# Patient Record
Sex: Female | Born: 1955 | Race: White | Hispanic: No | Smoking: Former smoker
Health system: Southern US, Community
[De-identification: ages and names within clinical notes are randomized; demographics above are authoritative.]

## PROBLEM LIST (undated history)

## (undated) DIAGNOSIS — Z9889 Other specified postprocedural states: Secondary | ICD-10-CM

## (undated) DIAGNOSIS — D649 Anemia, unspecified: Secondary | ICD-10-CM

## (undated) DIAGNOSIS — M1990 Unspecified osteoarthritis, unspecified site: Secondary | ICD-10-CM

## (undated) DIAGNOSIS — E0590 Thyrotoxicosis, unspecified without thyrotoxic crisis or storm: Secondary | ICD-10-CM

## (undated) DIAGNOSIS — R112 Nausea with vomiting, unspecified: Secondary | ICD-10-CM

## (undated) DIAGNOSIS — K219 Gastro-esophageal reflux disease without esophagitis: Secondary | ICD-10-CM

## (undated) DIAGNOSIS — R011 Cardiac murmur, unspecified: Secondary | ICD-10-CM

## (undated) DIAGNOSIS — J45909 Unspecified asthma, uncomplicated: Secondary | ICD-10-CM

## (undated) DIAGNOSIS — R609 Edema, unspecified: Secondary | ICD-10-CM

## (undated) DIAGNOSIS — R079 Chest pain, unspecified: Secondary | ICD-10-CM

## (undated) HISTORY — PX: APPENDECTOMY: SHX54

## (undated) HISTORY — PX: ROTATOR CUFF REPAIR: SHX139

## (undated) HISTORY — PX: ESOPHAGOGASTRODUODENOSCOPY: SHX1529

## (undated) HISTORY — PX: TUBAL LIGATION: SHX77

## (undated) HISTORY — PX: COLONOSCOPY W/ POLYPECTOMY: SHX1380

## (undated) HISTORY — PX: TONSILLECTOMY: SUR1361

## (undated) HISTORY — PX: FL INJ RT SHOULDER  MR ATHRGM (ARMC HX): HXRAD1296

## (undated) HISTORY — PX: ANKLE ARTHODESIS W/ ARTHROSCOPY: SUR63

## (undated) HISTORY — PX: EXCISIONAL HEMORRHOIDECTOMY: SHX1541

---

## 2004-08-11 ENCOUNTER — Ambulatory Visit: Payer: Self-pay | Admitting: Unknown Physician Specialty

## 2004-09-07 ENCOUNTER — Ambulatory Visit: Payer: Self-pay

## 2004-09-30 ENCOUNTER — Ambulatory Visit: Payer: Self-pay | Admitting: Obstetrics and Gynecology

## 2004-10-31 ENCOUNTER — Ambulatory Visit: Payer: Self-pay | Admitting: Surgery

## 2004-12-15 ENCOUNTER — Ambulatory Visit: Payer: Self-pay | Admitting: Surgery

## 2005-01-05 ENCOUNTER — Ambulatory Visit: Payer: Self-pay | Admitting: General Surgery

## 2005-07-13 ENCOUNTER — Ambulatory Visit: Payer: Self-pay | Admitting: Internal Medicine

## 2005-08-30 ENCOUNTER — Ambulatory Visit: Payer: Self-pay | Admitting: Obstetrics and Gynecology

## 2005-11-01 ENCOUNTER — Ambulatory Visit: Payer: Self-pay | Admitting: Internal Medicine

## 2006-01-11 ENCOUNTER — Ambulatory Visit: Payer: Self-pay | Admitting: General Surgery

## 2006-07-23 ENCOUNTER — Ambulatory Visit: Payer: Self-pay | Admitting: Obstetrics and Gynecology

## 2006-08-28 ENCOUNTER — Ambulatory Visit: Payer: Self-pay | Admitting: Internal Medicine

## 2006-08-29 ENCOUNTER — Ambulatory Visit: Payer: Self-pay | Admitting: Obstetrics and Gynecology

## 2006-08-29 ENCOUNTER — Other Ambulatory Visit: Payer: Self-pay

## 2006-09-07 ENCOUNTER — Ambulatory Visit: Payer: Self-pay | Admitting: Obstetrics and Gynecology

## 2006-10-19 ENCOUNTER — Ambulatory Visit: Payer: Self-pay | Admitting: Obstetrics and Gynecology

## 2007-01-15 ENCOUNTER — Ambulatory Visit: Payer: Self-pay | Admitting: General Surgery

## 2007-04-02 ENCOUNTER — Ambulatory Visit: Payer: Self-pay | Admitting: Internal Medicine

## 2007-04-08 ENCOUNTER — Ambulatory Visit: Payer: Self-pay | Admitting: Internal Medicine

## 2007-04-15 ENCOUNTER — Ambulatory Visit: Payer: Self-pay | Admitting: Internal Medicine

## 2007-04-25 ENCOUNTER — Ambulatory Visit: Payer: Self-pay | Admitting: Internal Medicine

## 2007-06-19 ENCOUNTER — Ambulatory Visit: Payer: Self-pay | Admitting: Internal Medicine

## 2007-09-17 ENCOUNTER — Ambulatory Visit: Payer: Self-pay | Admitting: Internal Medicine

## 2007-12-05 ENCOUNTER — Ambulatory Visit: Payer: Self-pay | Admitting: General Surgery

## 2007-12-20 ENCOUNTER — Ambulatory Visit: Payer: Self-pay | Admitting: General Surgery

## 2008-01-17 ENCOUNTER — Ambulatory Visit: Payer: Self-pay | Admitting: General Surgery

## 2008-04-03 ENCOUNTER — Ambulatory Visit: Payer: Self-pay

## 2008-05-22 ENCOUNTER — Ambulatory Visit: Payer: Self-pay | Admitting: Podiatry

## 2009-01-21 ENCOUNTER — Ambulatory Visit: Payer: Self-pay

## 2009-08-25 ENCOUNTER — Ambulatory Visit: Payer: Self-pay | Admitting: General Practice

## 2009-09-23 ENCOUNTER — Encounter: Payer: Self-pay | Admitting: Physician Assistant

## 2009-10-10 ENCOUNTER — Encounter: Payer: Self-pay | Admitting: Physician Assistant

## 2009-11-04 ENCOUNTER — Ambulatory Visit: Payer: Self-pay | Admitting: Unknown Physician Specialty

## 2009-11-10 ENCOUNTER — Encounter: Payer: Self-pay | Admitting: Physician Assistant

## 2009-12-10 ENCOUNTER — Encounter: Payer: Self-pay | Admitting: Physician Assistant

## 2010-01-03 ENCOUNTER — Ambulatory Visit: Payer: Self-pay

## 2010-01-10 ENCOUNTER — Encounter: Payer: Self-pay | Admitting: Physician Assistant

## 2010-01-14 ENCOUNTER — Ambulatory Visit: Payer: Self-pay | Admitting: Unknown Physician Specialty

## 2010-02-10 ENCOUNTER — Encounter: Payer: Self-pay | Admitting: Physician Assistant

## 2011-02-17 ENCOUNTER — Ambulatory Visit: Payer: Self-pay | Admitting: Internal Medicine

## 2011-03-07 ENCOUNTER — Ambulatory Visit: Payer: Self-pay

## 2011-03-27 ENCOUNTER — Encounter: Payer: Self-pay | Admitting: Neurosurgery

## 2011-04-13 ENCOUNTER — Encounter: Payer: Self-pay | Admitting: Neurosurgery

## 2011-11-06 ENCOUNTER — Other Ambulatory Visit: Payer: Self-pay | Admitting: Internal Medicine

## 2011-11-06 LAB — CBC WITH DIFFERENTIAL/PLATELET
Basophil #: 0.1 10*3/uL (ref 0.0–0.1)
Eosinophil #: 0.1 10*3/uL (ref 0.0–0.7)
Eosinophil %: 1.3 %
HCT: 36.8 % (ref 35.0–47.0)
HGB: 12.1 g/dL (ref 12.0–16.0)
Lymphocyte %: 21.5 %
MCHC: 33 g/dL (ref 32.0–36.0)
Monocyte %: 5.3 %
Neutrophil %: 71.1 %
Platelet: 306 10*3/uL (ref 150–440)
RBC: 4.19 10*6/uL (ref 3.80–5.20)
WBC: 7.2 10*3/uL (ref 3.6–11.0)

## 2011-11-06 LAB — BASIC METABOLIC PANEL
Anion Gap: 11 (ref 7–16)
Calcium, Total: 9.1 mg/dL (ref 8.5–10.1)
Chloride: 105 mmol/L (ref 98–107)
EGFR (African American): >60
EGFR (Non-African Amer.): >60
Glucose: 84 mg/dL (ref 65–99)
Osmolality: 284 (ref 275–301)

## 2011-11-06 LAB — URINALYSIS, COMPLETE
Blood: NEGATIVE
Glucose,UR: NEGATIVE mg/dL (ref 0–75)
Ph: 6 (ref 4.5–8.0)
RBC,UR: <1 /HPF (ref 0–5)
Squamous Epithelial: 1
WBC UR: NONE SEEN /HPF (ref 0–5)

## 2011-11-06 LAB — LIPID PANEL
Cholesterol: 207 mg/dL — ABNORMAL HIGH (ref 0–200)
Ldl Cholesterol, Calc: 106 mg/dL — ABNORMAL HIGH (ref 0–100)
VLDL Cholesterol, Calc: 21 mg/dL (ref 5–40)

## 2011-11-06 LAB — HEPATIC FUNCTION PANEL A (ARMC)
Alkaline Phosphatase: 96 U/L (ref 50–136)
Bilirubin,Total: 0.3 mg/dL (ref 0.2–1.0)
Total Protein: 7.5 g/dL (ref 6.4–8.2)

## 2011-11-06 LAB — IRON: Iron: 38 ug/dL — ABNORMAL LOW (ref 50–170)

## 2011-11-06 LAB — TSH: Thyroid Stimulating Horm: 1.31 u[IU]/mL

## 2011-11-07 ENCOUNTER — Ambulatory Visit: Payer: Self-pay | Admitting: Internal Medicine

## 2014-02-24 ENCOUNTER — Ambulatory Visit: Payer: Self-pay | Admitting: Internal Medicine

## 2015-01-04 ENCOUNTER — Other Ambulatory Visit: Payer: Self-pay | Admitting: Internal Medicine

## 2015-01-04 DIAGNOSIS — Z1231 Encounter for screening mammogram for malignant neoplasm of breast: Secondary | ICD-10-CM

## 2015-02-26 ENCOUNTER — Ambulatory Visit: Payer: Self-pay

## 2015-03-05 ENCOUNTER — Ambulatory Visit
Admission: RE | Admit: 2015-03-05 | Discharge: 2015-03-05 | Disposition: A | Discharge status: Home / Self Care | Payer: BC Managed Care – PPO | Source: Ambulatory Visit | Attending: Internal Medicine | Admitting: Internal Medicine

## 2015-03-05 DIAGNOSIS — Z1231 Encounter for screening mammogram for malignant neoplasm of breast: Secondary | ICD-10-CM | POA: Diagnosis not present

## 2015-12-23 ENCOUNTER — Encounter: Payer: Self-pay | Admitting: *Deleted

## 2015-12-24 ENCOUNTER — Ambulatory Visit: Payer: BC Managed Care – PPO | Admitting: Certified Registered Nurse Anesthetist

## 2015-12-24 ENCOUNTER — Encounter
Admission: RE | Disposition: A | Discharge status: Home / Self Care | Payer: Self-pay | Source: Ambulatory Visit | Attending: Unknown Physician Specialty

## 2015-12-24 ENCOUNTER — Ambulatory Visit
Admission: RE | Admit: 2015-12-24 | Discharge: 2015-12-24 | Disposition: A | Discharge status: Home / Self Care | Payer: BC Managed Care – PPO | Source: Ambulatory Visit | Attending: Unknown Physician Specialty | Admitting: Unknown Physician Specialty

## 2015-12-24 DIAGNOSIS — Z1211 Encounter for screening for malignant neoplasm of colon: Secondary | ICD-10-CM | POA: Insufficient documentation

## 2015-12-24 DIAGNOSIS — K648 Other hemorrhoids: Secondary | ICD-10-CM | POA: Diagnosis not present

## 2015-12-24 DIAGNOSIS — Z86010 Personal history of colonic polyps: Secondary | ICD-10-CM | POA: Insufficient documentation

## 2015-12-24 DIAGNOSIS — K640 First degree hemorrhoids: Secondary | ICD-10-CM | POA: Insufficient documentation

## 2015-12-24 DIAGNOSIS — Z6838 Body mass index (BMI) 38.0-38.9, adult: Secondary | ICD-10-CM | POA: Insufficient documentation

## 2015-12-24 DIAGNOSIS — Z791 Long term (current) use of non-steroidal anti-inflammatories (NSAID): Secondary | ICD-10-CM | POA: Insufficient documentation

## 2015-12-24 DIAGNOSIS — Z87891 Personal history of nicotine dependence: Secondary | ICD-10-CM | POA: Diagnosis not present

## 2015-12-24 DIAGNOSIS — Z800 Family history of malignant neoplasm of digestive organs: Secondary | ICD-10-CM | POA: Diagnosis not present

## 2015-12-24 DIAGNOSIS — K219 Gastro-esophageal reflux disease without esophagitis: Secondary | ICD-10-CM | POA: Insufficient documentation

## 2015-12-24 DIAGNOSIS — D123 Benign neoplasm of transverse colon: Secondary | ICD-10-CM | POA: Diagnosis not present

## 2015-12-24 DIAGNOSIS — Z803 Family history of malignant neoplasm of breast: Secondary | ICD-10-CM | POA: Insufficient documentation

## 2015-12-24 DIAGNOSIS — D122 Benign neoplasm of ascending colon: Secondary | ICD-10-CM | POA: Diagnosis not present

## 2015-12-24 DIAGNOSIS — J45909 Unspecified asthma, uncomplicated: Secondary | ICD-10-CM | POA: Diagnosis not present

## 2015-12-24 DIAGNOSIS — M1990 Unspecified osteoarthritis, unspecified site: Secondary | ICD-10-CM | POA: Diagnosis not present

## 2015-12-24 DIAGNOSIS — K635 Polyp of colon: Secondary | ICD-10-CM | POA: Insufficient documentation

## 2015-12-24 DIAGNOSIS — E0590 Thyrotoxicosis, unspecified without thyrotoxic crisis or storm: Secondary | ICD-10-CM | POA: Insufficient documentation

## 2015-12-24 DIAGNOSIS — Z9889 Other specified postprocedural states: Secondary | ICD-10-CM | POA: Diagnosis not present

## 2015-12-24 DIAGNOSIS — K5730 Diverticulosis of large intestine without perforation or abscess without bleeding: Secondary | ICD-10-CM | POA: Insufficient documentation

## 2015-12-24 HISTORY — DX: Nausea with vomiting, unspecified: R11.2

## 2015-12-24 HISTORY — DX: Cardiac murmur, unspecified: R01.1

## 2015-12-24 HISTORY — DX: Unspecified asthma, uncomplicated: J45.909

## 2015-12-24 HISTORY — DX: Edema, unspecified: R60.9

## 2015-12-24 HISTORY — DX: Other specified postprocedural states: Z98.890

## 2015-12-24 HISTORY — DX: Chest pain, unspecified: R07.9

## 2015-12-24 HISTORY — DX: Thyrotoxicosis, unspecified without thyrotoxic crisis or storm: E05.90

## 2015-12-24 HISTORY — DX: Gastro-esophageal reflux disease without esophagitis: K21.9

## 2015-12-24 HISTORY — DX: Unspecified osteoarthritis, unspecified site: M19.90

## 2015-12-24 HISTORY — PX: COLONOSCOPY WITH PROPOFOL: SHX5780

## 2015-12-24 HISTORY — DX: Anemia, unspecified: D64.9

## 2015-12-24 SURGERY — COLONOSCOPY WITH PROPOFOL
Anesthesia: General

## 2015-12-24 MED ORDER — SODIUM CHLORIDE 0.9 % IV SOLN
INTRAVENOUS | Status: DC
  Administered 2015-12-24: 1000 mL via INTRAVENOUS

## 2015-12-24 MED ORDER — PROPOFOL 10 MG/ML IV BOLUS
INTRAVENOUS | Status: DC | PRN
  Administered 2015-12-24: 50 mg via INTRAVENOUS
  Administered 2015-12-24: 10 mg via INTRAVENOUS

## 2015-12-24 MED ORDER — LIDOCAINE HCL (CARDIAC) 20 MG/ML IV SOLN
INTRAVENOUS | Status: DC | PRN
  Administered 2015-12-24: 40 mg via INTRAVENOUS

## 2015-12-24 MED ORDER — SODIUM CHLORIDE 0.9 % IV SOLN: INTRAVENOUS | Status: DC

## 2015-12-24 MED ORDER — PROPOFOL 500 MG/50ML IV EMUL
INTRAVENOUS | Status: DC | PRN
  Administered 2015-12-24: 160 ug/kg/min via INTRAVENOUS

## 2015-12-24 NOTE — H&P (Signed)
   Primary Care Physician:  Adin Hector, MD Primary Gastroenterologist:  Dr. Vira Agar  Pre-Procedure History & Physical: HPI:  Stacy Jones is a 60 y.o. female is here for an colonoscopy.   Past Medical History  Diagnosis Date  . Asthma   . Arthritis   . GERD (gastroesophageal reflux disease)   . Chest pain   . Edema     ankles  . Hyperthyroidism   . Anemia   . PONV (postoperative nausea and vomiting)   . Heart murmur     Past Surgical History  Procedure Laterality Date  . Colonoscopy w/ polypectomy    . Cesarean section    . Tonsillectomy    . Appendectomy    . Excisional hemorrhoidectomy    . Ankle arthodesis w/ arthroscopy    . Fl inj rt shoulder  mr athrgm ([facility] hx)    . Tubal ligation    . Rotator cuff repair Left   . Esophagogastroduodenoscopy      Prior to Admission medications   Medication Sig Start Date End Date Taking? Authorizing Provider  calcium carbonate (TUMS - DOSED IN MG ELEMENTAL CALCIUM) 500 MG chewable tablet Chew 1 tablet by mouth daily.   Yes Historical Provider, MD  naproxen sodium (ANAPROX) 220 MG tablet Take 220 mg by mouth 2 (two) times daily with a meal.   Yes Historical Provider, MD    Allergies as of 11/29/2015  . (Not on File)    Family History  Problem Relation Age of Onset  . Breast cancer Maternal Aunt   . Breast cancer Paternal Aunt     Social History   Social History  . Marital Status: Married    Spouse Name: N/A  . Number of Children: N/A  . Years of Education: N/A   Occupational History  . Not on file.   Social History Main Topics  . Smoking status: Former Research scientist (life sciences)  . Smokeless tobacco: Not on file  . Alcohol Use: No  . Drug Use: No  . Sexual Activity: Not on file   Other Topics Concern  . Not on file   Social History Narrative    Review of Systems: See HPI, otherwise negative ROS  Physical Exam: BP 145/78 mmHg  Pulse 63  Temp(Src) 98.5 F (36.9 C) (Tympanic)  Resp 18  Ht 5\' 3"  (1.6 m)  Wt  99.791 kg (220 lb)  BMI 38.98 kg/m2  SpO2 99% General:   Alert,  pleasant and cooperative in NAD Head:  Normocephalic and atraumatic. Neck:  Supple; no masses or thyromegaly. Lungs:  Clear throughout to auscultation.    Heart:  Regular rate and rhythm. Abdomen:  Soft, nontender and nondistended. Normal bowel sounds, without guarding, and without rebound.   Neurologic:  Alert and  oriented x4;  grossly normal neurologically.  Impression/Plan: Stacy Jones is here for an colonoscopy to be performed for [facility] colon polyps  Risks, benefits, limitations, and alternatives regarding  colonoscopy have been reviewed with the patient.  Questions have been answered.  All parties agreeable.   Gaylyn Cheers, MD  12/24/2015, 1:19 PM

## 2015-12-24 NOTE — Transfer of Care (Signed)
Immediate Anesthesia Transfer of Care Note  Patient: Stacy Jones  Procedure(s) Performed: Procedure(s): COLONOSCOPY WITH PROPOFOL (N/A)  Patient Location: PACU  Anesthesia Type:General  Level of Consciousness: awake, alert  and oriented  Airway & Oxygen Therapy: Patient Spontanous Breathing and Patient connected to nasal cannula oxygen  Post-op Assessment: Report given to RN and Post -op Vital signs reviewed and stable  Post vital signs: Reviewed and stable  Last Vitals:  Filed Vitals:   12/24/15 1234  BP: 145/78  Pulse: 63  Temp: 36.9 C  Resp: 18    Last Pain: There were no vitals filed for this visit.       Complications: No apparent anesthesia complications

## 2015-12-24 NOTE — Anesthesia Preprocedure Evaluation (Signed)
Anesthesia Evaluation  Patient identified by MRN, date of birth, ID band Patient awake    Reviewed: Allergy & Precautions, H&P , NPO status , Patient's Chart, lab work & pertinent test results, reviewed documented beta blocker date and time   History of Anesthesia Complications (+) PONV and history of anesthetic complications  Airway Mallampati: I  TM Distance: >3 FB Neck ROM: full    Dental no notable dental hx. (+) Caps, Teeth Intact   Pulmonary neg shortness of breath, asthma , neg sleep apnea, neg COPD, neg recent URI, former smoker,    Pulmonary exam normal breath sounds clear to auscultation       Cardiovascular Exercise Tolerance: Good (-) angina(-) CAD, (-) Past MI, (-) Cardiac Stents and (-) CABG Normal cardiovascular exam(-) dysrhythmias + Valvular Problems/Murmurs  Rhythm:regular Rate:Normal     Neuro/Psych negative neurological ROS  negative psych ROS   GI/Hepatic Neg liver ROS, GERD  ,  Endo/Other  neg diabetesMorbid obesity  Renal/GU negative Renal ROS  negative genitourinary   Musculoskeletal   Abdominal   Peds  Hematology  (+) Blood dyscrasia, anemia ,   Anesthesia Other Findings Past Medical History:   Asthma                                                       Arthritis                                                    GERD (gastroesophageal reflux disease)                       Chest pain                                                   Edema                                                          Comment:ankles   Hyperthyroidism                                              Anemia                                                       PONV (postoperative nausea and vomiting)                     Heart murmur  Reproductive/Obstetrics negative OB ROS                             Anesthesia Physical Anesthesia  Plan  ASA: III  Anesthesia Plan: General   Post-op Pain Management:    Induction:   Airway Management Planned:   Additional Equipment:   Intra-op Plan:   Post-operative Plan:   Informed Consent: I have reviewed the patients History and Physical, chart, labs and discussed the procedure including the risks, benefits and alternatives for the proposed anesthesia with the patient or authorized representative who has indicated his/her understanding and acceptance.   Dental Advisory Given  Plan Discussed with: Anesthesiologist, CRNA and Surgeon  Anesthesia Plan Comments:         Anesthesia Quick Evaluation

## 2015-12-24 NOTE — Op Note (Signed)
[facility] Gastroenterology Patient Name: Stacy Jones Procedure Date: 12/24/2015 1:21 PM MRN: [id number] Account #: [id number] Date of Birth: 08-Jun-1956 Admit Type: Outpatient Age: 60 Room: [facility] ENDO ROOM 3 Gender: Female Note Status: Finalized Procedure:            Colonoscopy Indications:          High risk colon cancer surveillance: Personal history                        of colonic polyps Providers:            Manya Silvas, MD Referring MD:         Ramonita Lab, MD (Referring MD) Medicines:            Propofol per Anesthesia Procedure:            Pre-Anesthesia Assessment:                       - After reviewing the risks and benefits, the patient                        was deemed in satisfactory condition to undergo the                        procedure.                       After obtaining informed consent, the colonoscope was                        passed under direct vision. Throughout the procedure,                        the patient's blood pressure, pulse, and oxygen                        saturations were monitored continuously. The                        Colonoscope was introduced through the anus and                        advanced to the the cecum, identified by appendiceal                        orifice and ileocecal valve. The colonoscopy was                        performed without difficulty. The patient tolerated the                        procedure well. The quality of the bowel preparation                        was excellent. Findings:      A small polyp was found in the hepatic flexure. The polyp was sessile.       The polyp was removed with a hot snare. Resection and retrieval were       complete.      Three sessile polyps were found in the ascending colon. The polyps were  diminutive in size. These polyps were removed with a jumbo cold forceps.       Resection and retrieval were complete.      Internal hemorrhoids were found  during endoscopy. The hemorrhoids were       small and Grade I (internal hemorrhoids that do not prolapse).      A few small-mouthed diverticula were found in the sigmoid colon.      The exam was otherwise without abnormality. Impression:           - One small polyp at the hepatic flexure, removed with                        a hot snare. Resected and retrieved.                       - Three diminutive polyps in the ascending colon,                        removed with a jumbo cold forceps. Resected and                        retrieved.                       - Internal hemorrhoids.                       - The examination was otherwise normal. Recommendation:       - Await pathology results. Manya Silvas, MD 12/24/2015 2:16:19 PM This report has been signed electronically. Number of Addenda: 0 Note Initiated On: 12/24/2015 1:21 PM Scope Withdrawal Time: 0 hours 9 minutes 15 seconds  Total Procedure Duration: 0 hours 18 minutes 45 seconds       [facility]

## 2015-12-25 NOTE — Progress Notes (Signed)
PT. Has a queasy stomach but said she is better. Refused advisement to call dr and stated she would call later on if she did not feel better.

## 2015-12-26 ENCOUNTER — Encounter: Payer: Self-pay | Admitting: Unknown Physician Specialty

## 2015-12-26 NOTE — Anesthesia Postprocedure Evaluation (Signed)
Anesthesia Post Note  Patient: Clarita Leber Correia  Procedure(s) Performed: Procedure(s) (LRB): COLONOSCOPY WITH PROPOFOL (N/A)  Patient location during evaluation: Endoscopy Anesthesia Type: General Level of consciousness: awake and alert Pain management: pain level controlled Vital Signs Assessment: post-procedure vital signs reviewed and stable Respiratory status: spontaneous breathing, nonlabored ventilation, respiratory function stable and patient connected to nasal cannula oxygen Cardiovascular status: blood pressure returned to baseline and stable Postop Assessment: no signs of nausea or vomiting Anesthetic complications: no    Last Vitals:  Filed Vitals:   12/24/15 1450 12/24/15 1452  BP: 123/49 123/49  Pulse: 49 51  Temp:    Resp: 20 13    Last Pain:  Filed Vitals:   12/25/15 1453  PainSc: 0-No pain                 Martha Clan

## 2015-12-29 LAB — SURGICAL PATHOLOGY

## 2016-01-27 ENCOUNTER — Other Ambulatory Visit: Payer: Self-pay | Admitting: Internal Medicine

## 2016-01-27 DIAGNOSIS — Z1231 Encounter for screening mammogram for malignant neoplasm of breast: Secondary | ICD-10-CM

## 2016-03-06 ENCOUNTER — Ambulatory Visit: Payer: BC Managed Care – PPO

## 2016-03-08 ENCOUNTER — Other Ambulatory Visit: Payer: Self-pay | Admitting: Internal Medicine

## 2016-03-08 ENCOUNTER — Ambulatory Visit
Admission: RE | Admit: 2016-03-08 | Discharge: 2016-03-08 | Disposition: A | Discharge status: Home / Self Care | Payer: BC Managed Care – PPO | Source: Ambulatory Visit | Attending: Internal Medicine | Admitting: Internal Medicine

## 2016-03-08 DIAGNOSIS — Z1231 Encounter for screening mammogram for malignant neoplasm of breast: Secondary | ICD-10-CM

## 2017-01-24 ENCOUNTER — Other Ambulatory Visit: Payer: Self-pay | Admitting: Internal Medicine

## 2017-01-24 DIAGNOSIS — Z1231 Encounter for screening mammogram for malignant neoplasm of breast: Secondary | ICD-10-CM

## 2017-03-14 ENCOUNTER — Ambulatory Visit
Admission: RE | Admit: 2017-03-14 | Discharge: 2017-03-14 | Disposition: A | Discharge status: Home / Self Care | Payer: BC Managed Care – PPO | Source: Ambulatory Visit | Attending: Internal Medicine | Admitting: Internal Medicine

## 2017-03-14 DIAGNOSIS — Z1231 Encounter for screening mammogram for malignant neoplasm of breast: Secondary | ICD-10-CM | POA: Insufficient documentation

## 2017-07-20 ENCOUNTER — Emergency Department (HOSPITAL_COMMUNITY)
Admission: EM | Admit: 2017-07-20 | Discharge: 2017-07-20 | Disposition: A | Discharge status: Home / Self Care | Payer: BC Managed Care – PPO | Attending: Emergency Medicine | Admitting: Emergency Medicine

## 2017-07-20 ENCOUNTER — Other Ambulatory Visit: Payer: Self-pay

## 2017-07-20 ENCOUNTER — Encounter (HOSPITAL_COMMUNITY): Payer: Self-pay | Admitting: Emergency Medicine

## 2017-07-20 ENCOUNTER — Emergency Department (HOSPITAL_COMMUNITY): Payer: BC Managed Care – PPO

## 2017-07-20 DIAGNOSIS — J40 Bronchitis, not specified as acute or chronic: Secondary | ICD-10-CM

## 2017-07-20 DIAGNOSIS — Z87891 Personal history of nicotine dependence: Secondary | ICD-10-CM | POA: Insufficient documentation

## 2017-07-20 DIAGNOSIS — J101 Influenza due to other identified influenza virus with other respiratory manifestations: Secondary | ICD-10-CM | POA: Insufficient documentation

## 2017-07-20 DIAGNOSIS — R509 Fever, unspecified: Secondary | ICD-10-CM | POA: Diagnosis present

## 2017-07-20 DIAGNOSIS — Z79899 Other long term (current) drug therapy: Secondary | ICD-10-CM | POA: Diagnosis not present

## 2017-07-20 DIAGNOSIS — N390 Urinary tract infection, site not specified: Secondary | ICD-10-CM | POA: Insufficient documentation

## 2017-07-20 DIAGNOSIS — R55 Syncope and collapse: Secondary | ICD-10-CM | POA: Insufficient documentation

## 2017-07-20 LAB — INFLUENZA PANEL BY PCR (TYPE A & B)
INFLAPCR: NEGATIVE
Influenza B By PCR: POSITIVE — AB

## 2017-07-20 LAB — COMPREHENSIVE METABOLIC PANEL
ALBUMIN: 3.9 g/dL (ref 3.5–5.0)
ALK PHOS: 80 U/L (ref 38–126)
ALT: 18 U/L (ref 14–54)
ANION GAP: 10 (ref 5–15)
AST: 20 U/L (ref 15–41)
BILIRUBIN TOTAL: 0.4 mg/dL (ref 0.3–1.2)
BUN: 14 mg/dL (ref 6–20)
CALCIUM: 8.6 mg/dL — AB (ref 8.9–10.3)
CO2: 26 mmol/L (ref 22–32)
CREATININE: 1.08 mg/dL — AB (ref 0.44–1.00)
Chloride: 100 mmol/L — ABNORMAL LOW (ref 101–111)
GFR calc non Af Amer: 55 mL/min — ABNORMAL LOW (ref >60)
GLUCOSE: 122 mg/dL — AB (ref 65–99)
Potassium: 3.2 mmol/L — ABNORMAL LOW (ref 3.5–5.1)
Sodium: 136 mmol/L (ref 135–145)
Total Protein: 7.2 g/dL (ref 6.5–8.1)

## 2017-07-20 LAB — URINALYSIS, ROUTINE W REFLEX MICROSCOPIC
BILIRUBIN URINE: NEGATIVE
Glucose, UA: NEGATIVE mg/dL
HGB URINE DIPSTICK: NEGATIVE
Ketones, ur: NEGATIVE mg/dL
Nitrite: NEGATIVE
PH: 5 (ref 5.0–8.0)
Protein, ur: 30 mg/dL — AB
SPECIFIC GRAVITY, URINE: 1.017 (ref 1.005–1.030)

## 2017-07-20 LAB — CBC
HCT: 39.5 % (ref 36.0–46.0)
Hemoglobin: 12.7 g/dL (ref 12.0–15.0)
MCH: 29.3 pg (ref 26.0–34.0)
MCHC: 32.2 g/dL (ref 30.0–36.0)
MCV: 91.2 fL (ref 78.0–100.0)
PLATELETS: 221 10*3/uL (ref 150–400)
RBC: 4.33 MIL/uL (ref 3.87–5.11)
RDW: 13.4 % (ref 11.5–15.5)
WBC: 6.9 10*3/uL (ref 4.0–10.5)

## 2017-07-20 LAB — CBG MONITORING, ED: Glucose-Capillary: 116 mg/dL — ABNORMAL HIGH (ref 65–99)

## 2017-07-20 LAB — TROPONIN I: Troponin I: <0.03 ng/mL (ref <0.03)

## 2017-07-20 MED ORDER — ALBUTEROL SULFATE HFA 108 (90 BASE) MCG/ACT IN AERS
2.0000 | INHALATION_SPRAY | Freq: Once | RESPIRATORY_TRACT | Status: AC
  Administered 2017-07-20: 2 via RESPIRATORY_TRACT
  Filled 2017-07-20: qty 6.7

## 2017-07-20 MED ORDER — BENZONATATE 100 MG PO CAPS: 200.0000 mg | ORAL_CAPSULE | Freq: Three times a day (TID) | ORAL | 0 refills | Status: AC

## 2017-07-20 MED ORDER — ONDANSETRON HCL 4 MG PO TABS
4.0000 mg | ORAL_TABLET | Freq: Once | ORAL | Status: AC
  Administered 2017-07-20: 4 mg via ORAL
  Filled 2017-07-20: qty 1

## 2017-07-20 MED ORDER — OSELTAMIVIR PHOSPHATE 75 MG PO CAPS: 75.0000 mg | ORAL_CAPSULE | Freq: Two times a day (BID) | ORAL | 0 refills | Status: AC

## 2017-07-20 MED ORDER — BENZONATATE 100 MG PO CAPS
200.0000 mg | ORAL_CAPSULE | Freq: Once | ORAL | Status: AC
  Administered 2017-07-20: 200 mg via ORAL
  Filled 2017-07-20: qty 2

## 2017-07-20 MED ORDER — METHYLPREDNISOLONE SODIUM SUCC 125 MG IJ SOLR
80.0000 mg | Freq: Once | INTRAMUSCULAR | Status: AC
  Administered 2017-07-20: 80 mg via INTRAVENOUS
  Filled 2017-07-20: qty 2

## 2017-07-20 MED ORDER — DEXAMETHASONE 4 MG PO TABS: 4.0000 mg | ORAL_TABLET | Freq: Two times a day (BID) | ORAL | 0 refills | Status: AC

## 2017-07-20 MED ORDER — IPRATROPIUM-ALBUTEROL 0.5-2.5 (3) MG/3ML IN SOLN
3.0000 mL | Freq: Once | RESPIRATORY_TRACT | Status: AC
  Administered 2017-07-20: 3 mL via RESPIRATORY_TRACT
  Filled 2017-07-20: qty 3

## 2017-07-20 MED ORDER — POTASSIUM CHLORIDE CRYS ER 20 MEQ PO TBCR
40.0000 meq | EXTENDED_RELEASE_TABLET | Freq: Once | ORAL | Status: AC
  Administered 2017-07-20: 40 meq via ORAL
  Filled 2017-07-20: qty 2

## 2017-07-20 MED ORDER — ACETAMINOPHEN 500 MG PO TABS
1000.0000 mg | ORAL_TABLET | Freq: Once | ORAL | Status: AC
  Administered 2017-07-20: 1000 mg via ORAL
  Filled 2017-07-20: qty 2

## 2017-07-20 NOTE — ED Provider Notes (Signed)
[facility] EMERGENCY DEPARTMENT Provider Note   CSN: [id number] Arrival date & time: 07/20/17  1014     History   Chief Complaint Chief Complaint  Patient presents with  . Loss of Consciousness    HPI Stacy Jones is a 62 y.o. female.  Patient is a 62 year old female who presents EMS because of upper respiratory symptoms and passing out.  Patient states that she has been sick for approximately 3 days.  She has been having cough, congestion, and fever.  She is also noted wheezing.  Prior to her admission to the emergency department the patient states that she passed out for about 2 minutes, which is what she also shared with the EMS professionals.  No chest pain reported.  Patient does report a heaviness in her chest that she says seems to be related to the cough and wheezing.  It is also of note that the patient's daughter and granddaughter were both found to be positive for influenza a few days ago.  Patient has a history of asthma and she request to be evaluated for her multiple symptoms and situations.  No other syncopal episodes reported.   The history is provided by the patient.    Past Medical History:  Diagnosis Date  . Anemia   . Arthritis   . Asthma   . Chest pain   . Edema    ankles  . GERD (gastroesophageal reflux disease)   . Heart murmur   . Hyperthyroidism   . PONV (postoperative nausea and vomiting)     There are no active problems to display for this patient.   Past Surgical History:  Procedure Laterality Date  . ANKLE ARTHODESIS W/ ARTHROSCOPY    . APPENDECTOMY    . CESAREAN SECTION    . COLONOSCOPY W/ POLYPECTOMY    . COLONOSCOPY WITH PROPOFOL N/A 12/24/2015   Procedure: COLONOSCOPY WITH PROPOFOL;  Surgeon: Manya Silvas, MD;  Location: [facility] ENDOSCOPY;  Service: Endoscopy;  Laterality: N/A;  . ESOPHAGOGASTRODUODENOSCOPY    . EXCISIONAL HEMORRHOIDECTOMY    . FL INJ RT SHOULDER  MR ATHRGM ([facility] HX)    . ROTATOR CUFF REPAIR Left   .  TONSILLECTOMY    . TUBAL LIGATION      OB History    No data available       Home Medications    Prior to Admission medications   Medication Sig Start Date End Date Taking? Authorizing Provider  calcium carbonate (TUMS - DOSED IN MG ELEMENTAL CALCIUM) 500 MG chewable tablet Chew 1 tablet by mouth daily.    [provider]  naproxen sodium (ANAPROX) 220 MG tablet Take 220 mg by mouth 2 (two) times daily with a meal.    [provider]    Family History Family History  Problem Relation Age of Onset  . Breast cancer Maternal Aunt   . Breast cancer Paternal Aunt     Social History Social History   Tobacco Use  . Smoking status: Former Research scientist (life sciences)  . Smokeless tobacco: Never Used  Substance Use Topics  . Alcohol use: No  . Drug use: No     Allergies   Erythromycin; Hydrocodone-homatropine; and Percocet [oxycodone-acetaminophen]   Review of Systems Review of Systems  Constitutional: Positive for activity change, appetite change, fatigue and fever.       All ROS Neg except as noted in HPI  HENT: Positive for congestion, postnasal drip and sinus pressure. Negative for nosebleeds.   Eyes: Negative for photophobia  and discharge.  Respiratory: Positive for cough and wheezing. Negative for shortness of breath.   Cardiovascular: Negative for chest pain and palpitations.  Gastrointestinal: Negative for abdominal pain and blood in stool.  Genitourinary: Negative for dysuria, frequency and hematuria.  Musculoskeletal: Negative for arthralgias, back pain and neck pain.  Skin: Negative.   Neurological: Positive for syncope. Negative for dizziness, seizures and speech difficulty.  Psychiatric/Behavioral: Negative for confusion and hallucinations.     Physical Exam Updated Vital Signs BP 114/68 (BP Location: Left Arm)   Pulse 72   Temp 98.5 F (36.9 C) (Oral)   Resp 18   Ht 5\' 3"  (1.6 m)   Wt 99.8 kg (220 lb)   SpO2 98%   BMI 38.97 kg/m   Physical Exam    Constitutional: She is oriented to person, place, and time. She appears well-developed and well-nourished.  Non-toxic appearance.  HENT:  Head: Normocephalic.  Right Ear: Tympanic membrane and external ear normal.  Left Ear: Tympanic membrane and external ear normal.  Nasal congestion present.  Eyes: EOM and lids are normal. Pupils are equal, round, and reactive to light.  Neck: Normal range of motion. Neck supple. Carotid bruit is not present.  Cardiovascular: Normal rate, regular rhythm, normal heart sounds, intact distal pulses and normal pulses.  Pulmonary/Chest: No respiratory distress. She has wheezes. She has rhonchi.  Abdominal: Soft. Bowel sounds are normal. There is no tenderness. There is no guarding.  Musculoskeletal: Normal range of motion.  Lymphadenopathy:       Head (right side): No submandibular adenopathy present.       Head (left side): No submandibular adenopathy present.    She has no cervical adenopathy.  Neurological: She is alert and oriented to person, place, and time. She has normal strength. No cranial nerve deficit or sensory deficit.  Skin: Skin is warm and dry.  Psychiatric: She has a normal mood and affect. Her speech is normal.  Nursing note and vitals reviewed.    ED Treatments / Results  Labs (all labs ordered are listed, but only abnormal results are displayed) Labs Reviewed  COMPREHENSIVE METABOLIC PANEL - Abnormal; Notable for the following components:      Result Value   Potassium 3.2 (*)    Chloride 100 (*)    Glucose, Bld 122 (*)    Creatinine, Ser 1.08 (*)    Calcium 8.6 (*)    GFR calc non Af Amer 55 (*)    All other components within normal limits  CBG MONITORING, ED - Abnormal; Notable for the following components:   Glucose-Capillary 116 (*)    All other components within normal limits  CBC  TROPONIN I  URINALYSIS, ROUTINE W REFLEX MICROSCOPIC  INFLUENZA PANEL BY PCR (TYPE A & B)    EKG  EKG  Interpretation  Date/Time:  Friday July 20 2017 10:30:09 EST Ventricular Rate:  71 PR Interval:  174 QRS Duration: 80 QT Interval:  404 QTC Calculation: 439 R Axis:   34 Text Interpretation:  Normal sinus rhythm Normal ECG No STEMI.  Confirmed by Nanda Quinton [phone]) on 07/20/2017 10:37:12 AM       Radiology Dg Chest 2 View  Result Date: 07/20/2017 CLINICAL DATA:  Productive cough, congestion, body aches and shortness of breath since 07/17/2017. EXAM: CHEST  2 VIEW COMPARISON:  None. FINDINGS: Lungs are clear. Heart size is normal. No pneumothorax or pleural effusion. No acute bony abnormality. IMPRESSION: No acute disease. Electronically Signed   By: Inge Rise  M.D.   On: 07/20/2017 10:54    Procedures Procedures (including critical care time)  Medications Ordered in ED Medications  ipratropium-albuterol (DUONEB) 0.5-2.5 (3) MG/3ML nebulizer solution 3 mL (3 mLs Nebulization Given 07/20/17 1325)  albuterol (PROVENTIL HFA;VENTOLIN HFA) 108 (90 Base) MCG/ACT inhaler 2 puff (2 puffs Inhalation Given 07/20/17 1331)  methylPREDNISolone sodium succinate (SOLU-MEDROL) 125 mg/2 mL injection 80 mg (80 mg Intravenous Given 07/20/17 1319)  acetaminophen (TYLENOL) tablet 1,000 mg (1,000 mg Oral Given 07/20/17 1319)  benzonatate (TESSALON) capsule 200 mg (200 mg Oral Given 07/20/17 1318)     Initial Impression / Assessment and Plan / ED Course  I have reviewed the triage vital signs and the nursing notes.  Pertinent labs & imaging results that were available during my care of the patient were reviewed by me and considered in my medical decision making (see chart for details).       Final Clinical Impressions(s) / ED Diagnoses MDM  Signs reviewed.  No syncopal episodes noted while the patient is in the emergency department.  Electrocardiogram shows normal sinus rhythm.  There is no evidence of acute coronary findings, and there is no high degree blocks appreciated.  Urine analysis  shows a cloudy specimen with a specific gravity of 1.017.  The patient has a large leukocyte esterase and too many to count white blood cells.  The patient is also positive for influenza B.  The potassium is slightly low at 3.2, the creatinine is slightly elevated at 1.08.  The anion gap is normal at 10.  The troponin test is negative for acute event.  Chest x-ray is negative.  I discussed these findings with the patient in terms which she understands.  Patient will be placed on Tamiflu, Tessalon Perles, Decadron.  I discussed with the patient importance of good handwashing.  Of also discussed the importance of good hydration.  The patient will use her mask until symptoms have resolved.  She will follow-up with her primary physician within the next few days.    February, 2019.  503 PM Pt was not treated for UTI. I spoke with Mrs Caridi home. Discussed the UA findings. Rx for keflex called in to [facility]. [phone]. Culture could not be ordered as pt was discharged more than 2 hours ago.   Final diagnoses:  Influenza B  Bronchitis  Syncope and collapse  Urinary tract infection without hematuria, site unspecified    ED Discharge Orders        Ordered    oseltamivir (TAMIFLU) 75 MG capsule  Every 12 hours     07/20/17 1507    benzonatate (TESSALON PERLES) 100 MG capsule  3 times daily     07/20/17 1507    dexamethasone (DECADRON) 4 MG tablet  2 times daily with meals     07/20/17 1507       Lily Kocher, PA-C 07/21/17 1721    Margette Fast, MD 07/21/17 2003

## 2017-07-20 NOTE — ED Triage Notes (Signed)
Pt c/o of cough, fever,generalized aches, and headache since Tuesday. Daughter and grandchildren were positive for flu. Pt states that she passed out and loss consciousness for 2 minutes per EMS.

## 2017-07-20 NOTE — Discharge Instructions (Signed)
Your test suggest Influenza B and bronchitis. Please use a mask until symptoms have resolved.  Please drink lots of water, juices, Gatorade, etc.  Please wash hands frequently, have the entire family wash hands frequently.  Please use albuterol 2 puffs every 4 hours, use Decadron 2 tablets daily.  Use Tamiflu 2 times daily.  Use Tessalon Perles for cough.  Please see Dr. Caryl Comes, or return to the emergency department immediately if any changes, problems, or concerns.

## 2017-07-20 NOTE — ED Notes (Signed)
Pt states that she has been sick since Tuesday, recent sick contacts.  Pt states chest felt heavy on Tuesday, cough started Wednesday.  Fever started ? Tuesday night.  + wheezing per pt

## 2018-01-15 ENCOUNTER — Other Ambulatory Visit: Payer: Self-pay | Admitting: Internal Medicine

## 2018-01-15 DIAGNOSIS — Z1231 Encounter for screening mammogram for malignant neoplasm of breast: Secondary | ICD-10-CM

## 2018-03-15 ENCOUNTER — Ambulatory Visit
Admission: RE | Admit: 2018-03-15 | Discharge: 2018-03-15 | Disposition: A | Discharge status: Home / Self Care | Payer: BC Managed Care – PPO | Source: Ambulatory Visit | Attending: Internal Medicine | Admitting: Internal Medicine

## 2018-03-15 DIAGNOSIS — Z1231 Encounter for screening mammogram for malignant neoplasm of breast: Secondary | ICD-10-CM

## 2019-01-27 ENCOUNTER — Other Ambulatory Visit: Payer: Self-pay | Admitting: Internal Medicine

## 2019-01-27 DIAGNOSIS — Z1231 Encounter for screening mammogram for malignant neoplasm of breast: Secondary | ICD-10-CM

## 2021-04-22 ENCOUNTER — Other Ambulatory Visit: Payer: Self-pay | Admitting: Internal Medicine

## 2021-04-22 DIAGNOSIS — Z1231 Encounter for screening mammogram for malignant neoplasm of breast: Secondary | ICD-10-CM

## 2021-04-27 ENCOUNTER — Ambulatory Visit (HOSPITAL_COMMUNITY): Payer: BC Managed Care – PPO

## 2021-05-09 ENCOUNTER — Other Ambulatory Visit: Payer: Self-pay

## 2021-05-09 ENCOUNTER — Ambulatory Visit (HOSPITAL_COMMUNITY)
Admission: RE | Admit: 2021-05-09 | Discharge: 2021-05-09 | Disposition: A | Discharge status: Home / Self Care | Payer: 59 | Source: Ambulatory Visit | Attending: Internal Medicine | Admitting: Internal Medicine

## 2021-05-09 DIAGNOSIS — Z1231 Encounter for screening mammogram for malignant neoplasm of breast: Secondary | ICD-10-CM | POA: Diagnosis present

## 2021-07-12 DIAGNOSIS — H43811 Vitreous degeneration, right eye: Secondary | ICD-10-CM | POA: Diagnosis not present

## 2022-05-08 ENCOUNTER — Other Ambulatory Visit (HOSPITAL_COMMUNITY): Payer: Self-pay | Admitting: Internal Medicine

## 2022-05-08 DIAGNOSIS — Z1231 Encounter for screening mammogram for malignant neoplasm of breast: Secondary | ICD-10-CM

## 2022-05-18 ENCOUNTER — Ambulatory Visit (HOSPITAL_COMMUNITY)
Admission: RE | Admit: 2022-05-18 | Discharge: 2022-05-18 | Disposition: A | Discharge status: Home / Self Care | Payer: MEDICARE | Source: Ambulatory Visit | Attending: Internal Medicine | Admitting: Internal Medicine

## 2022-05-18 DIAGNOSIS — Z1231 Encounter for screening mammogram for malignant neoplasm of breast: Secondary | ICD-10-CM | POA: Insufficient documentation

## 2022-08-06 IMAGING — MG MM DIGITAL SCREENING BILAT W/ TOMO AND CAD
8 series · 8 of 24 positions shown · non-contrast
Comparison: Previous exam(s).

CLINICAL DATA: Screening.

EXAM:
DIGITAL SCREENING BILATERAL MAMMOGRAM WITH TOMOSYNTHESIS AND CAD
TECHNIQUE: Bilateral screening digital craniocaudal and mediolateral oblique
mammograms were obtained. Bilateral screening digital breast
tomosynthesis was performed. The images were evaluated with
computer-aided detection.

[L CC synth-2D]
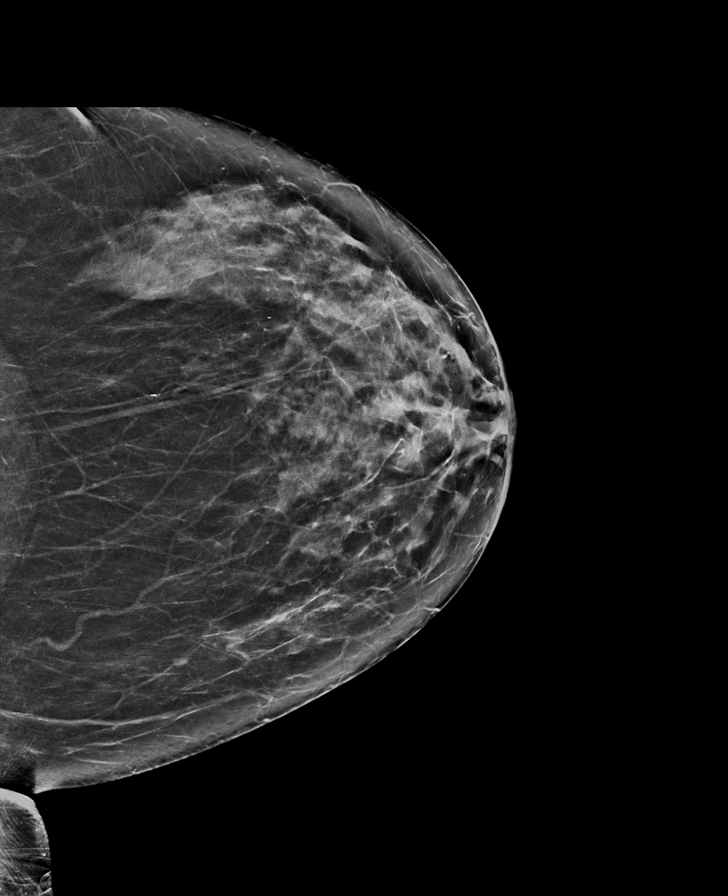

[R CC synth-2D]
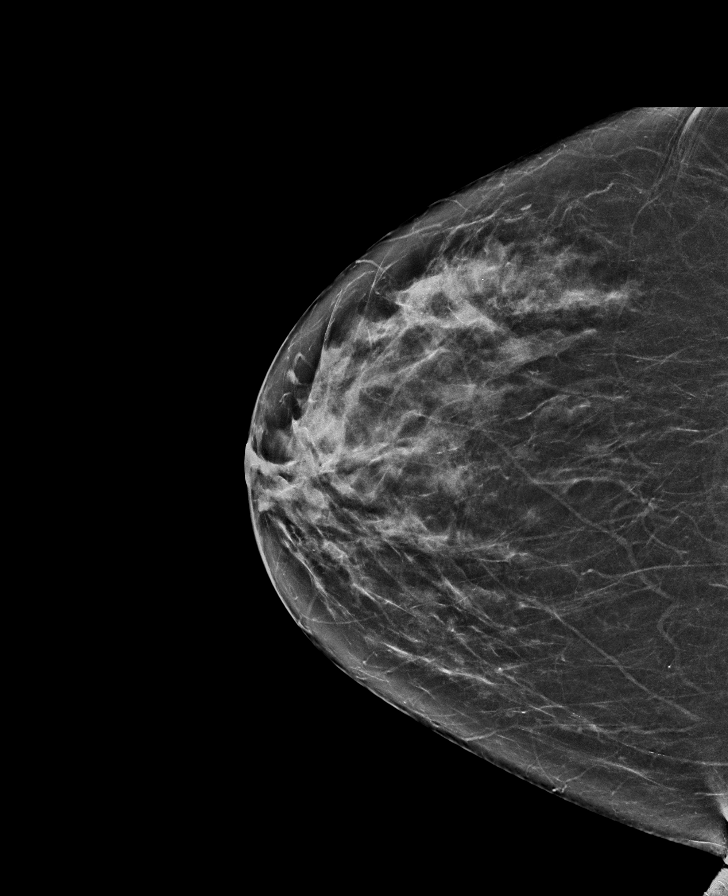

[L MLO synth-2D]
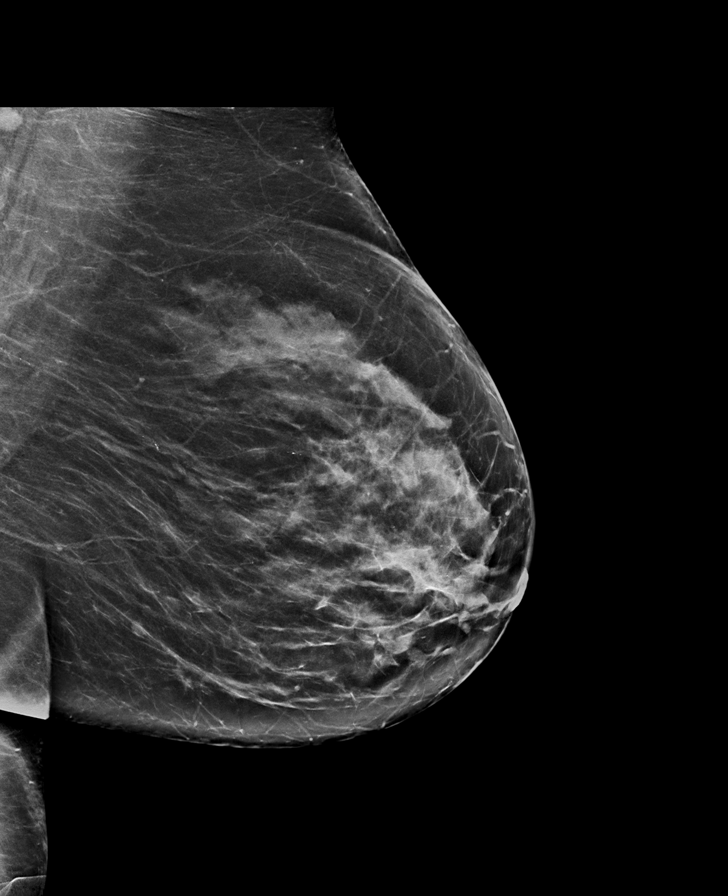

[R MLO synth-2D]
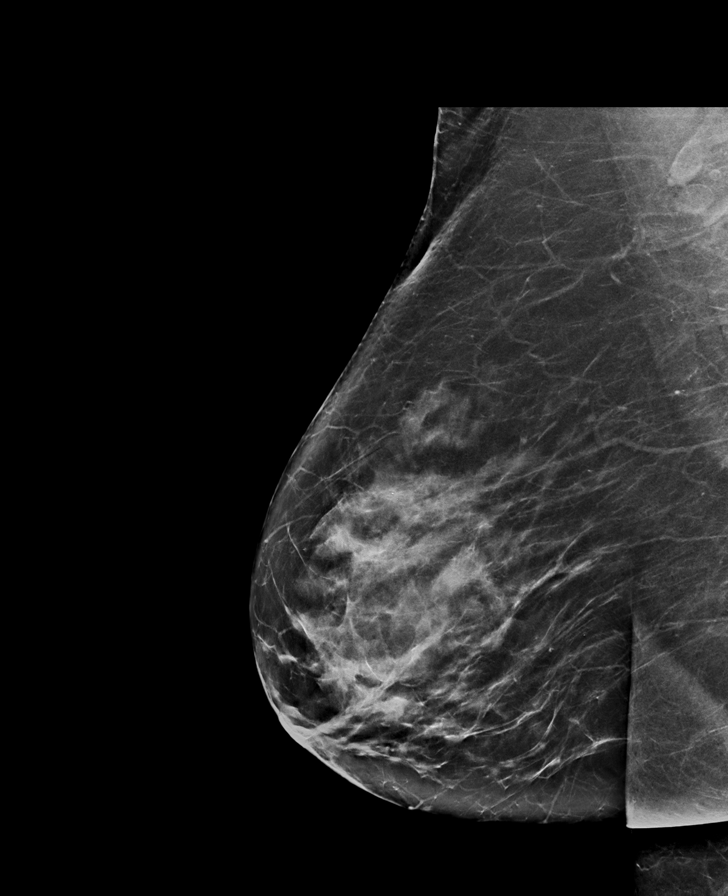

[R CC tomo · tomo slice 33/64.0]
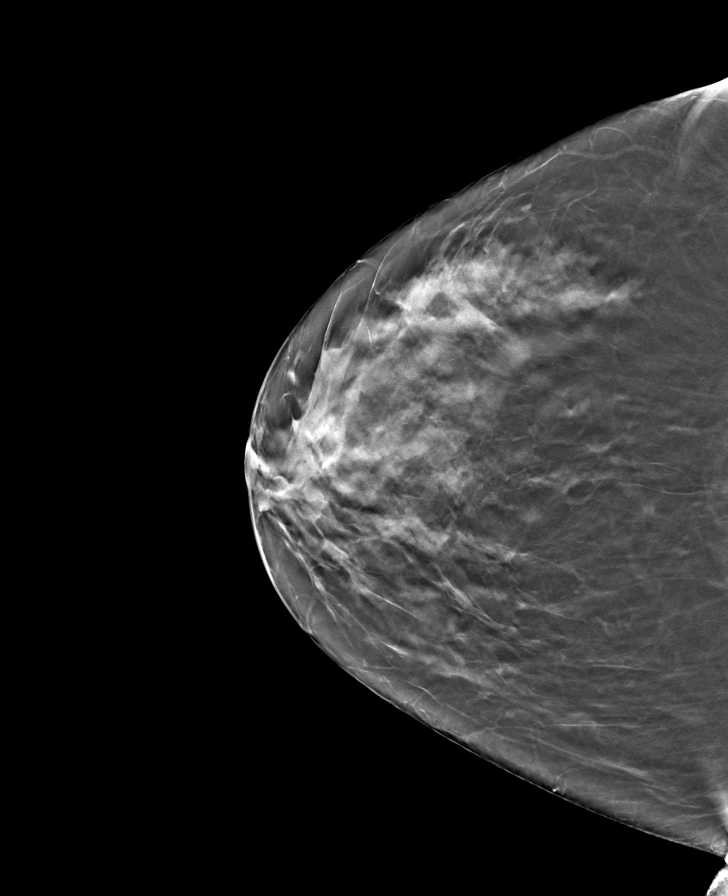

[R MLO tomo · tomo slice 41/80.0]
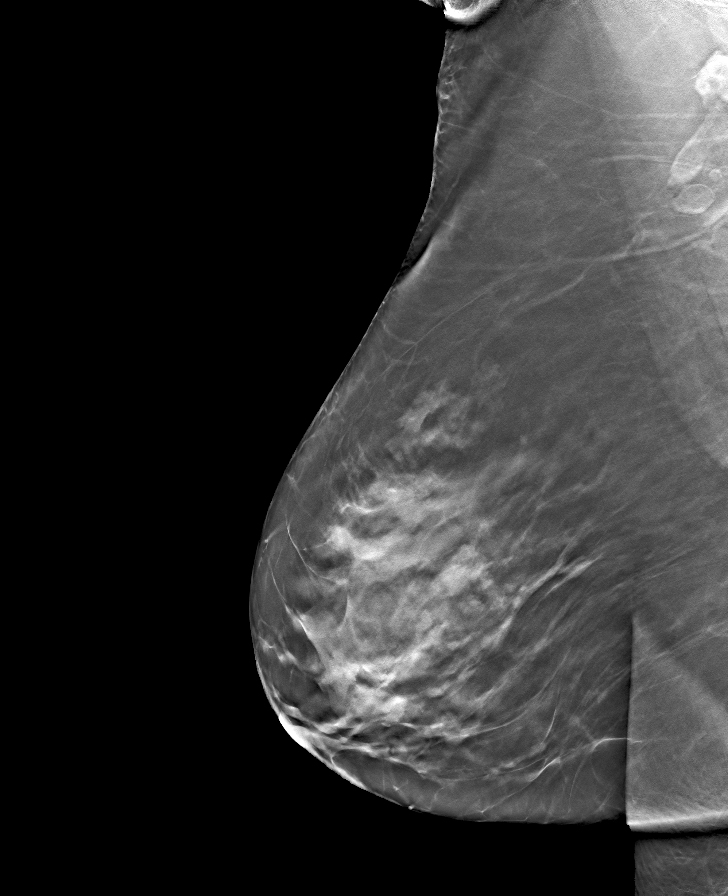

[L MLO tomo · tomo slice 38/75.0]
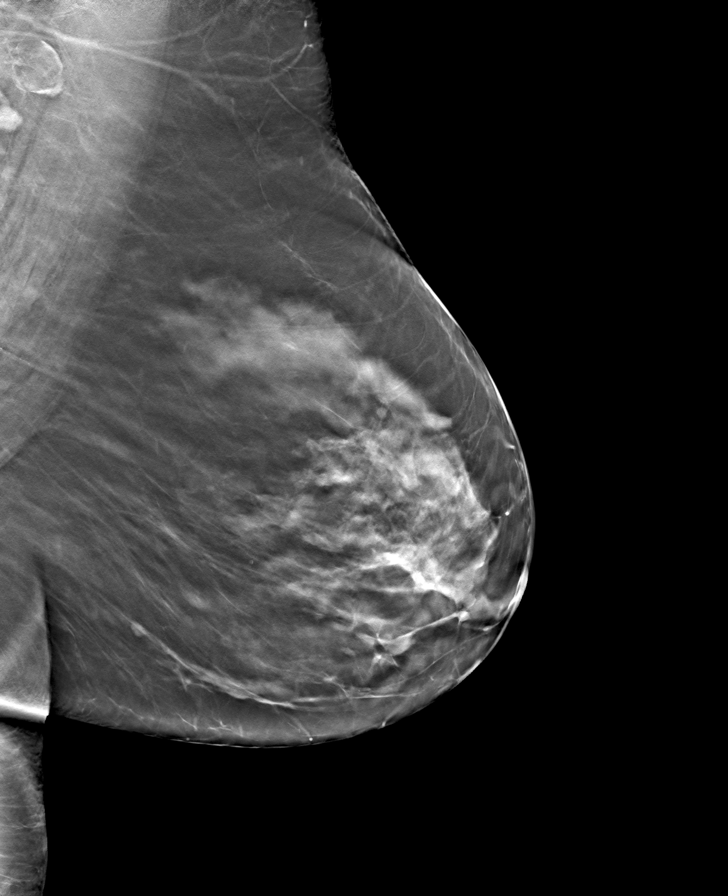

[L CC tomo · tomo slice 35/69.0]
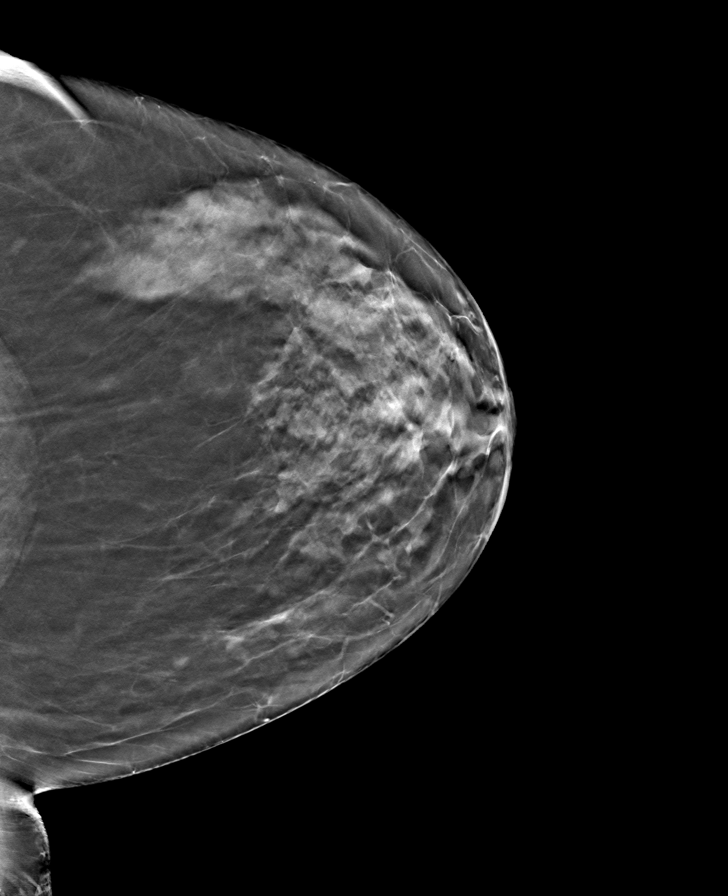

[8 of 24 positions shown; findings below may reference images not displayed]

ACR Breast Density Category c: The breast tissue is heterogeneously
dense, which may obscure small masses.
FINDINGS: There are no findings suspicious for malignancy.
IMPRESSION: No mammographic evidence of malignancy. A result letter of this
screening mammogram will be mailed directly to the patient.

RECOMMENDATION:
Screening mammogram in one year. (Code:Q3-W-BC3)

BI-RADS CATEGORY  1: Negative.

## 2022-08-18 ENCOUNTER — Encounter: Payer: Self-pay | Admitting: Gastroenterology

## 2022-08-21 ENCOUNTER — Ambulatory Visit: Payer: Medicare HMO | Admitting: Anesthesiology

## 2022-08-21 ENCOUNTER — Encounter: Payer: Self-pay | Admitting: Gastroenterology

## 2022-08-21 ENCOUNTER — Encounter
Admission: RE | Disposition: A | Discharge status: Home / Self Care | Payer: Self-pay | Source: Home / Self Care | Attending: Gastroenterology

## 2022-08-21 ENCOUNTER — Ambulatory Visit
Admission: RE | Admit: 2022-08-21 | Discharge: 2022-08-21 | Disposition: A | Discharge status: Home / Self Care | Payer: Medicare HMO | Attending: Gastroenterology | Admitting: Gastroenterology

## 2022-08-21 DIAGNOSIS — K5730 Diverticulosis of large intestine without perforation or abscess without bleeding: Secondary | ICD-10-CM | POA: Insufficient documentation

## 2022-08-21 DIAGNOSIS — Z9049 Acquired absence of other specified parts of digestive tract: Secondary | ICD-10-CM | POA: Diagnosis not present

## 2022-08-21 DIAGNOSIS — K640 First degree hemorrhoids: Secondary | ICD-10-CM | POA: Insufficient documentation

## 2022-08-21 DIAGNOSIS — Z800 Family history of malignant neoplasm of digestive organs: Secondary | ICD-10-CM | POA: Insufficient documentation

## 2022-08-21 DIAGNOSIS — D123 Benign neoplasm of transverse colon: Secondary | ICD-10-CM | POA: Insufficient documentation

## 2022-08-21 DIAGNOSIS — Z1211 Encounter for screening for malignant neoplasm of colon: Secondary | ICD-10-CM | POA: Diagnosis present

## 2022-08-21 DIAGNOSIS — K219 Gastro-esophageal reflux disease without esophagitis: Secondary | ICD-10-CM | POA: Diagnosis not present

## 2022-08-21 DIAGNOSIS — Z87891 Personal history of nicotine dependence: Secondary | ICD-10-CM | POA: Diagnosis not present

## 2022-08-21 HISTORY — PX: COLONOSCOPY: SHX5424

## 2022-08-21 SURGERY — COLONOSCOPY
Anesthesia: General

## 2022-08-21 MED ORDER — LIDOCAINE HCL (CARDIAC) PF 100 MG/5ML IV SOSY
PREFILLED_SYRINGE | INTRAVENOUS | Status: DC | PRN
  Administered 2022-08-21: 40 mg via INTRAVENOUS

## 2022-08-21 MED ORDER — PROPOFOL 1000 MG/100ML IV EMUL
INTRAVENOUS | Status: AC
  Filled 2022-08-21: qty 100

## 2022-08-21 MED ORDER — PROPOFOL 10 MG/ML IV BOLUS
INTRAVENOUS | Status: DC | PRN
  Administered 2022-08-21: 30 mg via INTRAVENOUS
  Administered 2022-08-21: 20 mg via INTRAVENOUS
  Administered 2022-08-21: 50 mg via INTRAVENOUS

## 2022-08-21 MED ORDER — PROPOFOL 500 MG/50ML IV EMUL
INTRAVENOUS | Status: DC | PRN
  Administered 2022-08-21: 100 ug/kg/min via INTRAVENOUS

## 2022-08-21 MED ORDER — SODIUM CHLORIDE 0.9 % IV SOLN
INTRAVENOUS | Status: DC
  Administered 2022-08-21: 20 mL/h via INTRAVENOUS

## 2022-08-21 NOTE — H&P (Signed)
Pre-Procedure H&P   Patient ID: Stacy Jones is a 67 y.o. female.  Gastroenterology Provider: Annamaria Helling, DO  Referring Provider: Dawson Bills, NP PCP: Adin Hector, MD  Date: 08/21/2022  HPI Stacy Jones is a 67 y.o. female who presents today for Colonoscopy for Surveillance-personal history colon polyps, family history colon cancer .  Patient noted to have increased abdominal bloating and discomfort.  She has alternating diarrhea and constipation )constipation predominant).  Underwent infectious stool workup which is negative.  Also negative alpha gal.  Hemoglobin 12.6 most recently  She is status post hysterectomy appendectomy C-section x 2 and hemorrhoidectomy  Mother had ovarian cancer and deceased at age 47 with metastases to colon.  Father also with cancer of unknown primary, possibly crc.  Last performed colonoscopy in December 2019 demonstrating 2 adenomatous polyps and internal hemorrhoids.  She was also noted to have 3 polyps in 2017 with 2 tubular adenomas and 1 SSP.  Normal egd in 05/2018  Most recent lab work hemoglobin 12.1 MCV 93 platelets 274,000   Past Medical History:  Diagnosis Date   Anemia    Arthritis    Asthma    Chest pain    Edema    ankles   GERD (gastroesophageal reflux disease)    Heart murmur    Hyperthyroidism    PONV (postoperative nausea and vomiting)     Past Surgical History:  Procedure Laterality Date   ANKLE ARTHODESIS W/ ARTHROSCOPY     APPENDECTOMY     CESAREAN SECTION     COLONOSCOPY W/ POLYPECTOMY     COLONOSCOPY WITH PROPOFOL N/A 12/24/2015   Procedure: COLONOSCOPY WITH PROPOFOL;  Surgeon: Manya Silvas, MD;  Location: [place];  Service: Endoscopy;  Laterality: N/A;   ESOPHAGOGASTRODUODENOSCOPY     EXCISIONAL HEMORRHOIDECTOMY     FL INJ RT SHOULDER  MR ATHRGM ([facility] HX)     ROTATOR CUFF REPAIR Left    TONSILLECTOMY     TUBAL LIGATION      Family History Mother had ovarian cancer and  deceased at age 52 with metastases to colon.  Father also with cancer of unknown primary, possibly crc. No h/o GI disease or malignancy  Review of Systems  Constitutional:  Negative for activity change, appetite change, chills, diaphoresis, fatigue, fever and unexpected weight change.  HENT:  Negative for trouble swallowing and voice change.   Respiratory:  Negative for shortness of breath and wheezing.   Cardiovascular:  Negative for chest pain, palpitations and leg swelling.  Gastrointestinal:  Positive for abdominal pain (+ bloating), constipation and diarrhea. Negative for abdominal distention, anal bleeding, blood in stool, nausea, rectal pain and vomiting.  Musculoskeletal:  Negative for arthralgias and myalgias.  Skin:  Negative for color change and pallor.  Neurological:  Negative for dizziness, syncope and weakness.  Psychiatric/Behavioral:  Negative for confusion.   All other systems reviewed and are negative.    Medications No current facility-administered medications on file prior to encounter.   Current Outpatient Medications on File Prior to Encounter  Medication Sig Dispense Refill   benzonatate (TESSALON PERLES) 100 MG capsule Take 2 capsules (200 mg total) by mouth 3 (three) times daily. 21 capsule 0   calcium carbonate (TUMS - DOSED IN MG ELEMENTAL CALCIUM) 500 MG chewable tablet Chew 1 tablet by mouth daily.     dexamethasone (DECADRON) 4 MG tablet Take 1 tablet (4 mg total) by mouth 2 (two) times daily with a meal. 10  tablet 0   naproxen sodium (ANAPROX) 220 MG tablet Take 220 mg by mouth 2 (two) times daily with a meal.     oseltamivir (TAMIFLU) 75 MG capsule Take 1 capsule (75 mg total) by mouth every 12 (twelve) hours. 10 capsule 0    Pertinent medications related to GI and procedure were reviewed by me with the patient prior to the procedure   Current Facility-Administered Medications:    0.9 %  sodium chloride infusion, , Intravenous, Continuous, Annamaria Helling, DO, Last Rate: 20 mL/hr at 08/21/22 0727, 20 mL/hr at 08/21/22 0727  sodium chloride 20 mL/hr (08/21/22 0727)       Allergies  Allergen Reactions   Erythromycin    Hydrocodone Bit-[place] Mbr    Percocet [Oxycodone-Acetaminophen]    Allergies were reviewed by me prior to the procedure  Objective   Body mass index is 37.41 kg/m. Vitals:   08/21/22 0713  BP: (!) 154/79  Pulse: 63  Resp: 20  Temp: (!) 97.3 F (36.3 C)  TempSrc: Temporal  SpO2: 100%  Weight: 95.8 kg     Physical Exam Vitals and nursing note reviewed.  Constitutional:      General: She is not in acute distress.    Appearance: Normal appearance. She is obese. She is not ill-appearing, toxic-appearing or diaphoretic.  HENT:     Head: Normocephalic and atraumatic.     Nose: Nose normal.     Mouth/Throat:     Mouth: Mucous membranes are moist.     Pharynx: Oropharynx is clear.  Eyes:     General: No scleral icterus.    Extraocular Movements: Extraocular movements intact.  Cardiovascular:     Rate and Rhythm: Normal rate and regular rhythm.     Heart sounds: Normal heart sounds. No murmur heard.    No friction rub. No gallop.  Pulmonary:     Effort: Pulmonary effort is normal. No respiratory distress.     Breath sounds: Normal breath sounds. No wheezing, rhonchi or rales.  Abdominal:     General: Bowel sounds are normal. There is no distension.     Palpations: Abdomen is soft.     Tenderness: There is no abdominal tenderness. There is no guarding or rebound.  Musculoskeletal:     Cervical back: Neck supple.     Right lower leg: No edema.     Left lower leg: No edema.  Skin:    General: Skin is warm and dry.     Coloration: Skin is not jaundiced or pale.  Neurological:     General: No focal deficit present.     Mental Status: She is alert and oriented to person, place, and time. Mental status is at baseline.  Psychiatric:        Mood and Affect: Mood normal.        Behavior: Behavior  normal.        Thought Content: Thought content normal.        Judgment: Judgment normal.      Assessment:  Stacy Jones is a 67 y.o. female  who presents today for Colonoscopy for Surveillance-personal history colon polyps, possible family history colon cancer .  Plan:  Colonoscopy with possible intervention today  Colonoscopy with possible biopsy, control of bleeding, polypectomy, and interventions as necessary has been discussed with the patient/patient representative. Informed consent was obtained from the patient/patient representative after explaining the indication, nature, and risks of the procedure including but not limited to death, bleeding,  perforation, missed neoplasm/lesions, cardiorespiratory compromise, and reaction to medications. Opportunity for questions was given and appropriate answers were provided. Patient/patient representative has verbalized understanding is amenable to undergoing the procedure.   Annamaria Helling, DO  [facility] Gastroenterology  Portions of the record may have been created with voice recognition software. Occasional wrong-word or 'sound-a-like' substitutions may have occurred due to the inherent limitations of voice recognition software.  Read the chart carefully and recognize, using context, where substitutions may have occurred.

## 2022-08-21 NOTE — Interval H&P Note (Signed)
History and Physical Interval Note: Preprocedure H&P from 08/21/22  was reviewed and there was no interval change after seeing and examining the patient.  Written consent was obtained from the patient after discussion of risks, benefits, and alternatives. Patient has consented to proceed with Colonoscopy with possible intervention   08/21/2022 8:32 AM  [name]  has presented today for surgery, with the diagnosis of Hx of adenomatous colonic polyps (Z86.010) FH: colon cancer (Z80.0) Change in bowel habits (R19.4).  The various methods of treatment have been discussed with the patient and family. After consideration of risks, benefits and other options for treatment, the patient has consented to  Procedure(s): COLONOSCOPY (N/A) as a surgical intervention.  The patient's history has been reviewed, patient examined, no change in status, stable for surgery.  I have reviewed the patient's chart and labs.  Questions were answered to the patient's satisfaction.     Stacy Jones

## 2022-08-21 NOTE — Transfer of Care (Signed)
Immediate Anesthesia Transfer of Care Note  Patient: Stacy Jones  Procedure(s) Performed: COLONOSCOPY  Patient Location: PACU and Endoscopy Unit  Anesthesia Type:General  Level of Consciousness: drowsy and patient cooperative  Airway & Oxygen Therapy: Patient Spontanous Breathing and Patient connected to face mask oxygen  Post-op Assessment: Report given to RN and Patient moving all extremities X 4  Post vital signs: Reviewed and stable  Last Vitals:  Vitals Value Taken Time  BP 99/45 08/21/22 0914  Temp 36.2 C 08/21/22 0913  Pulse 58 08/21/22 0915  Resp 17 08/21/22 0915  SpO2 100 % 08/21/22 0915  Vitals shown include unvalidated device data.  Last Pain:  Vitals:   08/21/22 0913  TempSrc: Temporal  PainSc: 0-No pain         Complications: No notable events documented.

## 2022-08-21 NOTE — Anesthesia Postprocedure Evaluation (Signed)
Anesthesia Post Note  Patient: Stacy Jones  Procedure(s) Performed: COLONOSCOPY  Patient location during evaluation: PACU Anesthesia Type: General Level of consciousness: awake and oriented Pain management: satisfactory to patient Vital Signs Assessment: post-procedure vital signs reviewed and stable Respiratory status: spontaneous breathing and respiratory function stable Cardiovascular status: stable Anesthetic complications: no   No notable events documented.   Last Vitals:  Vitals:   08/21/22 0913 08/21/22 0923  BP: (!) 99/45 124/65  Pulse: 63   Resp: 20   Temp: (!) 36.2 C   SpO2: 100%     Last Pain:  Vitals:   08/21/22 0923  TempSrc:   PainSc: 0-No pain                 VAN STAVEREN,Elian Gloster

## 2022-08-21 NOTE — Anesthesia Preprocedure Evaluation (Signed)
Anesthesia Evaluation  Patient identified by MRN, date of birth, ID band Patient awake    Reviewed: Allergy & Precautions, NPO status , Patient's Chart, lab work & pertinent test results  History of Anesthesia Complications (+) PONV and history of anesthetic complications  Airway Mallampati: II  TM Distance: >3 FB Neck ROM: full    Dental  (+) Teeth Intact   Pulmonary neg pulmonary ROS, asthma , Patient abstained from smoking., former smoker   Pulmonary exam normal breath sounds clear to auscultation + decreased breath sounds      Cardiovascular Exercise Tolerance: Good negative cardio ROS Normal cardiovascular exam+ Valvular Problems/Murmurs  Rhythm:Regular Rate:Normal     Neuro/Psych negative neurological ROS  negative psych ROS   GI/Hepatic negative GI ROS, Neg liver ROS,GERD  Medicated,,  Endo/Other  negative endocrine ROS Hyperthyroidism   Renal/GU negative Renal ROS  negative genitourinary   Musculoskeletal   Abdominal  (+) + obese  Peds negative pediatric ROS (+)  Hematology negative hematology ROS (+) Blood dyscrasia, anemia   Anesthesia Other Findings Past Medical History: No date: Anemia No date: Arthritis No date: Asthma No date: Chest pain No date: Edema     Comment:  ankles No date: GERD (gastroesophageal reflux disease) No date: Heart murmur No date: Hyperthyroidism No date: PONV (postoperative nausea and vomiting)  Past Surgical History: No date: ANKLE ARTHODESIS W/ ARTHROSCOPY No date: APPENDECTOMY No date: CESAREAN SECTION No date: COLONOSCOPY W/ POLYPECTOMY 12/24/2015: COLONOSCOPY WITH PROPOFOL; N/A     Comment:  Procedure: COLONOSCOPY WITH PROPOFOL;  Surgeon: Manya Silvas, MD;  Location: [facility] ENDOSCOPY;  Service:               Endoscopy;  Laterality: N/A; No date: ESOPHAGOGASTRODUODENOSCOPY No date: EXCISIONAL HEMORRHOIDECTOMY No date: FL INJ RT SHOULDER  MR ATHRGM  ([facility] HX) No date: ROTATOR CUFF REPAIR; Left No date: TONSILLECTOMY No date: TUBAL LIGATION  BMI    Body Mass Index: 37.41 kg/m      Reproductive/Obstetrics negative OB ROS                             Anesthesia Physical Anesthesia Plan  ASA: 2  Anesthesia Plan: General   Post-op Pain Management:    Induction: Intravenous  PONV Risk Score and Plan: Propofol infusion and TIVA  Airway Management Planned: Natural Airway  Additional Equipment:   Intra-op Plan:   Post-operative Plan:   Informed Consent: I have reviewed the patients History and Physical, chart, labs and discussed the procedure including the risks, benefits and alternatives for the proposed anesthesia with the patient or authorized representative who has indicated his/her understanding and acceptance.     Dental Advisory Given  Plan Discussed with: CRNA and Surgeon  Anesthesia Plan Comments:        Anesthesia Quick Evaluation

## 2022-08-21 NOTE — Op Note (Signed)
[facility] Gastroenterology Patient Name: Stacy Jones Procedure Date: 08/21/2022 8:20 AM MRN: [id number] Account #: [id number] Date of Birth: September 12, 1955 Admit Type: Outpatient Age: 67 Room: [facility] ENDO ROOM 2 Gender: Female Note Status: Finalized Instrument Name: Jasper Riling [id number] Procedure:             Colonoscopy Indications:           High risk colon cancer surveillance: Personal history                         of colonic polyps, Family history of colon cancer in                         multiple first-degree relatives Providers:             Annamaria Helling DO, DO Medicines:             Monitored Anesthesia Care Complications:         No immediate complications. Estimated blood loss:                         Minimal. Procedure:             Pre-Anesthesia Assessment:                        - Prior to the procedure, a History and Physical was                         performed, and patient medications and allergies were                         reviewed. The patient is competent. The risks and                         benefits of the procedure and the sedation options and                         risks were discussed with the patient. All questions                         were answered and informed consent was obtained.                         Patient identification and proposed procedure were                         verified by the physician, the nurse, the anesthetist                         and the technician in the endoscopy suite. Mental                         Status Examination: alert and oriented. Airway                         Examination: normal oropharyngeal airway and neck                         mobility. Respiratory Examination: clear to  auscultation. CV Examination: RRR, no murmurs, no S3                         or S4. Prophylactic Antibiotics: The patient does not                         require prophylactic antibiotics.  Prior                         Anticoagulants: The patient has taken no anticoagulant                         or antiplatelet agents. ASA Grade Assessment: II - A                         patient with mild systemic disease. After reviewing                         the risks and benefits, the patient was deemed in                         satisfactory condition to undergo the procedure. The                         anesthesia plan was to use monitored anesthesia care                         (MAC). Immediately prior to administration of                         medications, the patient was re-assessed for adequacy                         to receive sedatives. The heart rate, respiratory                         rate, oxygen saturations, blood pressure, adequacy of                         pulmonary ventilation, and response to care were                         monitored throughout the procedure. The physical                         status of the patient was re-assessed after the                         procedure.                        After obtaining informed consent, the colonoscope was                         passed under direct vision. Throughout the procedure,                         the patient's blood pressure, pulse, and oxygen  saturations were monitored continuously. The                         Colonoscope was introduced through the anus and                         advanced to the the terminal ileum, with                         identification of the appendiceal orifice and IC                         valve. The colonoscopy was performed without                         difficulty. The patient tolerated the procedure well.                         The quality of the bowel preparation was evaluated                         using the BBPS [facility] Bowel Preparation Scale) with                         scores of: Right Colon = 3 (entire mucosa seen well                          with no residual staining, small fragments of stool or                         opaque liquid), Transverse Colon = 3 (entire mucosa                         seen well with no residual staining, small fragments                         of stool or opaque liquid) and Left Colon = 2 (minor                         amount of residual staining, small fragments of stool                         and/or opaque liquid, but mucosa seen well). The total                         BBPS score equals 8. The quality of the bowel                         preparation was excellent. The terminal ileum,                         ileocecal valve, appendiceal orifice, and rectum were                         photographed. Findings:      The perianal and digital rectal examinations were normal. Pertinent       negatives include normal sphincter tone.  The terminal ileum appeared normal. Estimated blood loss: none.      Retroflexion in the right colon was performed.      A 2 to 3 mm polyp was found in the hepatic flexure. The polyp was       sessile. The polyp was removed with a jumbo cold forceps. Resection and       retrieval were complete. Estimated blood loss was minimal.      Scattered small-mouthed diverticula were found in the entire colon.       Estimated blood loss: none.      Non-bleeding internal hemorrhoids were found during retroflexion. The       hemorrhoids were Grade I (internal hemorrhoids that do not prolapse).       Estimated blood loss: none.      The exam was otherwise without abnormality on direct and retroflexion       views. Impression:            - The examined portion of the ileum was normal.                        - One 2 to 3 mm polyp at the hepatic flexure, removed                         with a jumbo cold forceps. Resected and retrieved.                        - Diverticulosis in the entire examined colon.                        - Non-bleeding internal hemorrhoids.                         - The examination was otherwise normal on direct and                         retroflexion views. Recommendation:        - Patient has a contact number available for                         emergencies. The signs and symptoms of potential                         delayed complications were discussed with the patient.                         Return to normal activities tomorrow. Written                         discharge instructions were provided to the patient.                        - Discharge patient to home.                        - Resume previous diet.                        - Continue present medications.                        -  Await pathology results.                        - Repeat colonoscopy for surveillance based on                         pathology results.                        - Return to referring physician as previously                         scheduled.                        - The findings and recommendations were discussed with                         the patient. Procedure Code(s):     --- Professional ---                        [phone], Colonoscopy, flexible; with biopsy, single or                         multiple Diagnosis Code(s):     --- Professional ---                        Z86.010, Personal history of colonic polyps                        K64.0, First degree hemorrhoids                        D12.3, Benign neoplasm of transverse colon (hepatic                         flexure or splenic flexure)                        Z80.0, Family history of malignant neoplasm of                         digestive organs                        K57.30, Diverticulosis of large intestine without                         perforation or abscess without bleeding CPT copyright 2022 American [employer] Association. All rights reserved. The codes documented in this report are preliminary and upon coder review may  be revised to meet current compliance requirements. Attending  Participation:      I personally performed the entire procedure. Volney American, DO Annamaria Helling DO, DO 08/21/2022 9:13:41 AM This report has been signed electronically. Number of Addenda: 0 Note Initiated On: 08/21/2022 8:20 AM Scope Withdrawal Time: 0 hours 15 minutes 57 seconds  Total Procedure Duration: 0 hours 23 minutes 31 seconds  Estimated Blood Loss:  Estimated blood loss was minimal.      [facility]

## 2022-08-21 NOTE — Anesthesia Procedure Notes (Signed)
Procedure Name: General with mask airway Date/Time: 08/21/2022 8:44 AM  Performed by: Hilbert Odor, CRNAPre-anesthesia Checklist: Patient identified, Emergency Drugs available, Suction available, Patient being monitored and Timeout performed Oxygen Delivery Method: Simple face mask Preoxygenation: Pre-oxygenation with 100% oxygen Induction Type: IV induction Comments: [place]

## 2022-08-22 ENCOUNTER — Encounter: Payer: Self-pay | Admitting: Gastroenterology

## 2022-08-22 LAB — SURGICAL PATHOLOGY

## 2022-10-10 ENCOUNTER — Inpatient Hospital Stay: Payer: Medicare HMO | Admitting: Licensed Clinical Social Worker

## 2022-10-10 ENCOUNTER — Inpatient Hospital Stay: Payer: Medicare HMO

## 2022-12-26 ENCOUNTER — Ambulatory Visit
Admission: EM | Admit: 2022-12-26 | Discharge: 2022-12-26 | Disposition: A | Discharge status: Home / Self Care | Payer: Medicare HMO | Attending: Nurse Practitioner | Admitting: Nurse Practitioner

## 2022-12-26 DIAGNOSIS — N3000 Acute cystitis without hematuria: Secondary | ICD-10-CM | POA: Diagnosis present

## 2022-12-26 LAB — POCT URINALYSIS DIP (MANUAL ENTRY)
Bilirubin, UA: NEGATIVE
Blood, UA: NEGATIVE
Glucose, UA: NEGATIVE mg/dL
Ketones, POC UA: NEGATIVE mg/dL
Leukocytes, UA: NEGATIVE
Nitrite, UA: POSITIVE — AB
Protein Ur, POC: NEGATIVE mg/dL
Spec Grav, UA: 1.015 (ref 1.010–1.025)
Urobilinogen, UA: 0.2 E.U./dL
pH, UA: 7.5 (ref 5.0–8.0)

## 2022-12-26 MED ORDER — SULFAMETHOXAZOLE-TRIMETHOPRIM 800-160 MG PO TABS: 1.0000 | ORAL_TABLET | Freq: Two times a day (BID) | ORAL | 0 refills | Status: AC

## 2022-12-26 NOTE — Discharge Instructions (Signed)
The urinalysis suggest that you did have a urinary tract infection. Take medication as prescribed. May take over-the-counter Tylenol as needed for pain, fever, general discomfort. Make sure you are drinking plenty of fluids.  Recommend drinking at least 10-12 8 ounce glasses of water while symptoms persist. Develop a toileting schedule that will allow you to urinate every 2 hours. Avoid caffeine such as tea, soda, and coffee while symptoms persist. If you are sexually active, make sure you are voiding at least 15 to 20 minutes after sexual intercourse. Go to the emergency department immediately if you experience fever, worsening low back pain, pain around your kidneys, or new symptoms while taking the medication. As discussed, a urine culture is pending.  If the results of the culture are negative, you will be contacted and advised to stop the antibiotic.  If you are continuing to experience symptoms, I would like for you to follow-up with your gynecologist for further evaluation. Follow-up as needed.

## 2022-12-26 NOTE — ED Provider Notes (Signed)
[facility]    CSN: [id number] Arrival date & time: 12/26/22  2130      History   Chief Complaint No chief complaint on file.   HPI Stacy Jones is a 67 y.o. female.   The history is provided by the patient.   The patient presents for complaints of urinary symptoms that started approximately 3 days ago.  Patient complains of chills, urinary frequency, low back pain, and pelvic pressure and discomfort.  Patient denies dysuria, flank pain, decreased urine stream, hematuria, vaginal discharge, vaginal odor, or vaginal itching.  Patient reports that she does have a history of recurrent urinary tract infection, but has been sometime since she has had 1.  She has also seen gynecology in the past for complaints of pelvic pressure and overactive bladder.  Patient states that she has been taking Azo over-the-counter with some relief.  Today, patient states "I just feel bad."  Past Medical History:  Diagnosis Date   Anemia    Arthritis    Asthma    Chest pain    Edema    ankles   GERD (gastroesophageal reflux disease)    Heart murmur    Hyperthyroidism    PONV (postoperative nausea and vomiting)     There are no problems to display for this patient.   Past Surgical History:  Procedure Laterality Date   ANKLE ARTHODESIS W/ ARTHROSCOPY     APPENDECTOMY     CESAREAN SECTION     COLONOSCOPY N/A 08/21/2022   Procedure: COLONOSCOPY;  Surgeon: Jaynie Collins, DO;  Location: [facility] ENDOSCOPY;  Service: Gastroenterology;  Laterality: N/A;   COLONOSCOPY W/ POLYPECTOMY     COLONOSCOPY WITH PROPOFOL N/A 12/24/2015   Procedure: COLONOSCOPY WITH PROPOFOL;  Surgeon: Scot Jun, MD;  Location: [facility] ENDOSCOPY;  Service: Endoscopy;  Laterality: N/A;   ESOPHAGOGASTRODUODENOSCOPY     EXCISIONAL HEMORRHOIDECTOMY     FL INJ RT SHOULDER  MR ATHRGM ([facility] HX)     ROTATOR CUFF REPAIR Left    TONSILLECTOMY     TUBAL LIGATION      OB History   No obstetric history on file.       Home Medications    Prior to Admission medications   Medication Sig Start Date End Date Taking? Authorizing Provider  benzonatate (TESSALON PERLES) 100 MG capsule Take 2 capsules (200 mg total) by mouth 3 (three) times daily. 07/20/17   Ivery Quale, PA-C  calcium carbonate (TUMS - DOSED IN MG ELEMENTAL CALCIUM) 500 MG chewable tablet Chew 1 tablet by mouth daily.    [provider]  dexamethasone (DECADRON) 4 MG tablet Take 1 tablet (4 mg total) by mouth 2 (two) times daily with a meal. 07/20/17   Ivery Quale, PA-C  naproxen sodium (ANAPROX) 220 MG tablet Take 220 mg by mouth 2 (two) times daily with a meal.    [provider]  oseltamivir (TAMIFLU) 75 MG capsule Take 1 capsule (75 mg total) by mouth every 12 (twelve) hours. 07/20/17   Ivery Quale, PA-C    Family History Family History  Problem Relation Age of Onset   Breast cancer Maternal Aunt    Breast cancer Paternal Aunt     Social History Social History   Tobacco Use   Smoking status: Former   Smokeless tobacco: Never  Vaping Use   Vaping status: Never Used  Substance Use Topics   Alcohol use: No   Drug use: No     Allergies  Erythromycin, Hydrocodone bit-homatrop mbr, and Percocet [oxycodone-acetaminophen]   Review of Systems Review of Systems Per HPI  Physical Exam Triage Vital Signs ED Triage Vitals  Encounter Vitals Group     BP 12/26/22 0929 (!) 151/76     Systolic BP Percentile --      Diastolic BP Percentile --      Pulse Rate 12/26/22 0929 65     Resp 12/26/22 0929 15     Temp 12/26/22 0929 97.8 F (36.6 C)     Temp Source 12/26/22 0929 Oral     SpO2 12/26/22 0929 97 %     Weight --      Height --      Head Circumference --      Peak Flow --      Pain Score 12/26/22 0933 3     Pain Loc --      Pain Education --      Exclude from Growth Chart --    No data found.  Updated Vital Signs BP (!) 151/76 (BP Location: Right Arm)   Pulse 65   Temp 97.8 F (36.6 C)  (Oral)   Resp 15   SpO2 97%   Visual Acuity Right Eye Distance:   Left Eye Distance:   Bilateral Distance:    Right Eye Near:   Left Eye Near:    Bilateral Near:     Physical Exam Vitals and nursing note reviewed.  Constitutional:      General: She is not in acute distress.    Appearance: Normal appearance.  HENT:     Head: Normocephalic.  Eyes:     Extraocular Movements: Extraocular movements intact.     Pupils: Pupils are equal, round, and reactive to light.  Cardiovascular:     Rate and Rhythm: Normal rate and regular rhythm.     Pulses: Normal pulses.     Heart sounds: Normal heart sounds.  Pulmonary:     Effort: Pulmonary effort is normal. No respiratory distress.     Breath sounds: Normal breath sounds. No stridor. No wheezing, rhonchi or rales.  Abdominal:     General: Bowel sounds are normal.     Palpations: Abdomen is soft.     Tenderness: There is abdominal tenderness in the suprapubic area. There is no right CVA tenderness or left CVA tenderness.  Musculoskeletal:     Cervical back: Normal range of motion.  Lymphadenopathy:     Cervical: No cervical adenopathy.  Skin:    General: Skin is warm and dry.  Neurological:     General: No focal deficit present.     Mental Status: She is alert and oriented to person, place, and time.  Psychiatric:        Mood and Affect: Mood normal.        Behavior: Behavior normal.      UC Treatments / Results  Labs (all labs ordered are listed, but only abnormal results are displayed) Labs Reviewed  POCT URINALYSIS DIP (MANUAL ENTRY) - Abnormal; Notable for the following components:      Result Value   Nitrite, UA Positive (*)    All other components within normal limits    EKG   Radiology No results found.  Procedures Procedures (including critical care time)  Medications Ordered in UC Medications - No data to display  Initial Impression / Assessment and Plan / UC Course  I have reviewed the triage vital  signs and the nursing notes.  Pertinent labs &  imaging results that were available during my care of the patient were reviewed by me and considered in my medical decision making (see chart for details).  The patient is well-appearing, she is in no acute distress, she is mildly hypertensive, but vital signs are otherwise stable.  Urinalysis is positive for nitrates, suggestive of a urinary tract infection.  Will treat patient empirically with Bactrim DS 800/160 mg tablets.  Supportive care recommendations were provided and discussed with the patient to include over-the-counter analgesics for pain or discomfort, increasing fluid intake, developing a toileting schedule, and avoiding caffeine.  Patient was advised a urine culture is pending, this will ensure she is being treated with the appropriate antibiotic, and to confirm a UTI.  If the culture result is negative, and patient is continue to experience symptoms, she was advised to follow-up with gynecology.  Patient was given strict ER follow-up precautions.  Patient is in agreement with this plan of care and verbalizes understanding.  All questions were answered.  Patient stable for discharge.  Final Clinical Impressions(s) / UC Diagnoses   Final diagnoses:  None   Discharge Instructions   None    ED Prescriptions   None    PDMP not reviewed this encounter.   Abran Cantor, NP 12/26/22 1014

## 2022-12-26 NOTE — ED Triage Notes (Signed)
Pt c/o UTI sx's, back pain started Saturday, urinary frequency over night, Sunday night pain got worse. Pt has taking AZO Sunday night pelvic pressure and discomfort.

## 2022-12-28 LAB — URINE CULTURE: Culture: <10000 — AB

## 2023-01-03 ENCOUNTER — Other Ambulatory Visit: Payer: Self-pay | Admitting: Internal Medicine

## 2023-01-03 DIAGNOSIS — R1032 Left lower quadrant pain: Secondary | ICD-10-CM

## 2023-01-05 ENCOUNTER — Ambulatory Visit: Admission: RE | Admit: 2023-01-05 | Payer: Medicare HMO | Source: Ambulatory Visit

## 2023-01-05 DIAGNOSIS — R1032 Left lower quadrant pain: Secondary | ICD-10-CM | POA: Insufficient documentation

## 2023-11-14 ENCOUNTER — Other Ambulatory Visit: Payer: Self-pay | Admitting: Internal Medicine

## 2023-11-14 DIAGNOSIS — W19XXXS Unspecified fall, sequela: Secondary | ICD-10-CM

## 2023-11-14 DIAGNOSIS — M5412 Radiculopathy, cervical region: Secondary | ICD-10-CM
